# Patient Record
Sex: Female | Born: 1955 | Race: White | Hispanic: No | Marital: Married | Smoking: Never smoker
Health system: Southern US, Community
[De-identification: ages and names within clinical notes are randomized; demographics above are authoritative.]

## PROBLEM LIST (undated history)

## (undated) DIAGNOSIS — E119 Type 2 diabetes mellitus without complications: Secondary | ICD-10-CM

## (undated) HISTORY — PX: ABDOMINAL SURGERY: SHX537

---

## 2021-01-09 ENCOUNTER — Emergency Department: Payer: Medicare Other

## 2021-01-09 ENCOUNTER — Other Ambulatory Visit: Payer: Self-pay

## 2021-01-09 ENCOUNTER — Emergency Department
Admission: EM | Admit: 2021-01-09 | Discharge: 2021-01-09 | Disposition: A | Discharge status: Home / Self Care | Payer: Medicare Other | Attending: Emergency Medicine | Admitting: Emergency Medicine

## 2021-01-09 DIAGNOSIS — R519 Headache, unspecified: Secondary | ICD-10-CM | POA: Diagnosis not present

## 2021-01-09 DIAGNOSIS — M25522 Pain in left elbow: Secondary | ICD-10-CM | POA: Insufficient documentation

## 2021-01-09 DIAGNOSIS — S62102A Fracture of unspecified carpal bone, left wrist, initial encounter for closed fracture: Secondary | ICD-10-CM | POA: Insufficient documentation

## 2021-01-09 DIAGNOSIS — Z23 Encounter for immunization: Secondary | ICD-10-CM | POA: Diagnosis not present

## 2021-01-09 DIAGNOSIS — S50312A Abrasion of left elbow, initial encounter: Secondary | ICD-10-CM | POA: Insufficient documentation

## 2021-01-09 DIAGNOSIS — E119 Type 2 diabetes mellitus without complications: Secondary | ICD-10-CM | POA: Diagnosis not present

## 2021-01-09 DIAGNOSIS — W19XXXA Unspecified fall, initial encounter: Secondary | ICD-10-CM

## 2021-01-09 DIAGNOSIS — Y9289 Other specified places as the place of occurrence of the external cause: Secondary | ICD-10-CM | POA: Insufficient documentation

## 2021-01-09 DIAGNOSIS — W01198A Fall on same level from slipping, tripping and stumbling with subsequent striking against other object, initial encounter: Secondary | ICD-10-CM | POA: Insufficient documentation

## 2021-01-09 DIAGNOSIS — Y9389 Activity, other specified: Secondary | ICD-10-CM | POA: Insufficient documentation

## 2021-01-09 DIAGNOSIS — I10 Essential (primary) hypertension: Secondary | ICD-10-CM | POA: Insufficient documentation

## 2021-01-09 DIAGNOSIS — T148XXA Other injury of unspecified body region, initial encounter: Secondary | ICD-10-CM

## 2021-01-09 DIAGNOSIS — S6992XA Unspecified injury of left wrist, hand and finger(s), initial encounter: Secondary | ICD-10-CM | POA: Diagnosis present

## 2021-01-09 HISTORY — DX: Type 2 diabetes mellitus without complications: E11.9

## 2021-01-09 LAB — COMPREHENSIVE METABOLIC PANEL
ALT: 16 U/L (ref 0–44)
AST: 29 U/L (ref 15–41)
Albumin: 3.5 g/dL (ref 3.5–5.0)
Alkaline Phosphatase: 70 U/L (ref 38–126)
Anion gap: 6 (ref 5–15)
BUN: 20 mg/dL (ref 8–23)
CO2: 23 mmol/L (ref 22–32)
Calcium: 8.3 mg/dL — ABNORMAL LOW (ref 8.9–10.3)
Chloride: 108 mmol/L (ref 98–111)
Creatinine, Ser: 0.97 mg/dL (ref 0.44–1.00)
GFR, Estimated: >60 mL/min (ref >60)
Glucose, Bld: 153 mg/dL — ABNORMAL HIGH (ref 70–99)
Potassium: 4.4 mmol/L (ref 3.5–5.1)
Sodium: 137 mmol/L (ref 135–145)
Total Bilirubin: 0.5 mg/dL (ref 0.3–1.2)
Total Protein: 6.3 g/dL — ABNORMAL LOW (ref 6.5–8.1)

## 2021-01-09 LAB — CBC WITH DIFFERENTIAL/PLATELET
Abs Immature Granulocytes: 0.02 10*3/uL (ref 0.00–0.07)
Basophils Absolute: 0 10*3/uL (ref 0.0–0.1)
Basophils Relative: 0 %
Eosinophils Absolute: 0.3 10*3/uL (ref 0.0–0.5)
Eosinophils Relative: 6 %
HCT: 32.6 % — ABNORMAL LOW (ref 36.0–46.0)
Hemoglobin: 10.5 g/dL — ABNORMAL LOW (ref 12.0–15.0)
Immature Granulocytes: 0 %
Lymphocytes Relative: 23 %
Lymphs Abs: 1.3 10*3/uL (ref 0.7–4.0)
MCH: 28.7 pg (ref 26.0–34.0)
MCHC: 32.2 g/dL (ref 30.0–36.0)
MCV: 89.1 fL (ref 80.0–100.0)
Monocytes Absolute: 0.6 10*3/uL (ref 0.1–1.0)
Monocytes Relative: 10 %
Neutro Abs: 3.5 10*3/uL (ref 1.7–7.7)
Neutrophils Relative %: 61 %
Platelets: 227 10*3/uL (ref 150–400)
RBC: 3.66 MIL/uL — ABNORMAL LOW (ref 3.87–5.11)
RDW: 13.7 % (ref 11.5–15.5)
WBC: 5.8 10*3/uL (ref 4.0–10.5)
nRBC: 0 % (ref 0.0–0.2)

## 2021-01-09 LAB — TROPONIN I (HIGH SENSITIVITY): Troponin I (High Sensitivity): 17 ng/L (ref <18)

## 2021-01-09 MED ORDER — TETANUS-DIPHTH-ACELL PERTUSSIS 5-2.5-18.5 LF-MCG/0.5 IM SUSY
0.5000 mL | PREFILLED_SYRINGE | Freq: Once | INTRAMUSCULAR | Status: AC
  Administered 2021-01-09: 0.5 mL via INTRAMUSCULAR
  Filled 2021-01-09: qty 0.5

## 2021-01-09 MED ORDER — MORPHINE SULFATE (PF) 4 MG/ML IV SOLN
INTRAVENOUS | Status: AC
  Administered 2021-01-09: 4 mg via INTRAVENOUS
  Filled 2021-01-09: qty 1

## 2021-01-09 MED ORDER — ONDANSETRON HCL 4 MG/2ML IJ SOLN: 4.0000 mg | Freq: Once | INTRAMUSCULAR | Status: AC

## 2021-01-09 MED ORDER — ONDANSETRON HCL 4 MG/2ML IJ SOLN
INTRAMUSCULAR | Status: AC
  Administered 2021-01-09: 4 mg via INTRAVENOUS
  Filled 2021-01-09: qty 2

## 2021-01-09 MED ORDER — MORPHINE SULFATE (PF) 4 MG/ML IV SOLN: 4.0000 mg | Freq: Once | INTRAVENOUS | Status: AC

## 2021-01-09 MED ORDER — DIPHENHYDRAMINE HCL 50 MG/ML IJ SOLN
INTRAMUSCULAR | Status: AC
  Administered 2021-01-09: 25 mg via INTRAVENOUS
  Filled 2021-01-09: qty 1

## 2021-01-09 MED ORDER — DIPHENHYDRAMINE HCL 50 MG/ML IJ SOLN: 25.0000 mg | Freq: Once | INTRAMUSCULAR | Status: AC

## 2021-01-09 NOTE — ED Notes (Signed)
Pt with itchy red rash above IV site after morphine/zofran administration- EPD notified.

## 2021-01-09 NOTE — ED Provider Notes (Signed)
[facility] Emergency Department Provider Note  ____________________________________________   Event Date/Time   First MD Initiated Contact with Patient 01/09/21 1529     (approximate)  I have reviewed the triage vital signs and the nursing notes.   HISTORY  Chief Complaint Fall   HPI Teresa Hood is a 65 y.o. female who is coming out of bathroom and thinks she tripped.  She fell forward and hit her nose and complains of left wrist pain and deformity.  She also has elbow pain she has not skin tear over the left lateral elbow.  She thinks she hit her nose and had nosebleeding earlier but it has stopped now.  EMS reported the patient was weak and pale after standing up again.  Wrist and elbow pain is worse with movement.        Past Medical History:  Diagnosis Date   Diabetes mellitus without complication (HCC)   Also history of A. fib and GI surgery.  There are no problems to display for this patient.   Past Surgical History:  Procedure Laterality Date   ABDOMINAL SURGERY      Prior to Admission medications   Not on File    Allergies Keflex [cephalexin] and Iron dextran  History reviewed. No pertinent family history.  Social History Social History   Tobacco Use   Smoking status: Never   Smokeless tobacco: Never    Review of Systems  Constitutional: No fever/chills Eyes: No visual changes. ENT: No sore throat. Cardiovascular: Denies chest pain. Respiratory: Denies shortness of breath. Gastrointestinal: No abdominal pain.  No nausea, no vomiting.  No diarrhea.  No constipation. Genitourinary: Negative for dysuria. Musculoskeletal: Negative for back pain. Skin: Negative for rash. Neurological: Negative for headaches, focal weakness   ____________________________________________   PHYSICAL EXAM:  VITAL SIGNS: ED Triage Vitals  Enc Vitals Group     BP 01/09/21 1528 (!) 97/58     Pulse Rate 01/09/21 1528 68     Resp  01/09/21 1528 18     Temp 01/09/21 1536 97.8 F (36.6 C)     Temp Source 01/09/21 1536 Oral     SpO2 01/09/21 1528 91 %     Weight 01/09/21 1529 96 lb (43.5 kg)     Height 01/09/21 1529 5' (1.524 m)     Head Circumference --      Peak Flow --      Pain Score 01/09/21 1529 8     Pain Loc --      Pain Edu? --      Excl. in GC? --     Constitutional: Alert and oriented. Well appearing and in no acute distress. Eyes: Conjunctivae are normal. PERRL. EOMI. Head: Atraumatic. Nose: No congestion/rhinnorhea.  There is blown up inside the nose but no active bleeding no swelling and no nasal tenderness Mouth/Throat: Mucous membranes are moist.  Oropharynx non-erythematous. Neck: No stridor.  No cervical spine tenderness to palpation. Cardiovascular: Normal rate, regular rhythm. Grossly normal heart sounds.  Good peripheral circulation. Respiratory: Normal respiratory effort.  No retractions. Lungs CTAB. Gastrointestinal: Soft and nontender. No distention. No abdominal bruits. No CVA tenderness. Musculoskeletal: No lower extremity tenderness nor edema.  See HPI for upper extremities Neurologic:  Normal speech and language. No gross focal neurologic deficits are appreciated.  Skin:  Skin is warm, dry and intact.  Except for left elbow no rash noted.   ____________________________________________   LABS (all labs ordered are listed, but only abnormal results are  displayed)  Labs Reviewed  COMPREHENSIVE METABOLIC PANEL - Abnormal; Notable for the following components:      Result Value   Glucose, Bld 153 (*)    Calcium 8.3 (*)    Total Protein 6.3 (*)    All other components within normal limits  CBC WITH DIFFERENTIAL/PLATELET - Abnormal; Notable for the following components:   RBC 3.66 (*)    Hemoglobin 10.5 (*)    HCT 32.6 (*)    All other components within normal limits  TROPONIN I (HIGH SENSITIVITY)   ____________________________________________  EKG  EKG read interpreted by  me shows normal sinus rhythm rate of 67 normal axis no acute ST-T wave changes some low voltage in the precordial leads. ____________________________________________  RADIOLOGY Jill Poling, personally viewed and evaluated these images (plain radiographs) as part of my medical decision making, as well as reviewing the written report by the radiologist.  ED MD interpretation: X-ray of the wrist shows a almost nondisplaced ulnar fracture and slightly displaced radius fracture.  Radiology read the films and I reviewed them.  I do not see anything in the elbow needed this radiology.  There is a suggestion of possibly a anterior fat pad is a little bigger than I would expect in the elbow and patient does complain of some pain they are not sure if the pain is due to her skin tear or not she does not want a move it because it makes the wrist hurt.  I will give her a long-arm splint.  Just in case.  Chest x-ray read by radiology reviewed by me shows no acute problems and this is also true of the is CT of the head and C-spine both of which were reviewed by radiology and myself.  Official radiology report(s): DG Elbow Complete Left  Result Date: 01/09/2021 CLINICAL DATA:  Left elbow pain following a fall today. EXAM: LEFT ELBOW - COMPLETE 3+ VIEW COMPARISON:  None. FINDINGS: Moderate-sized olecranon enthesophyte. Mild anterior coronoid process spur formation. No fracture, dislocation or effusion. IMPRESSION: No fracture or dislocation. Electronically Signed   By: Beckie Salts M.D.   On: 01/09/2021 16:08   DG Wrist Complete Left  Result Date: 01/09/2021 CLINICAL DATA:  Left wrist pain following a fall today. EXAM: LEFT WRIST - COMPLETE 3+ VIEW COMPARISON:  None. FINDINGS: Comminuted fracture of the distal radial metaphysis and epiphysis with extension into the radiocarpal joint at the level of the radial styloid. Mild radial displacement of the distal fragments as well as ventral angulation of the distal  fragments. There is also a minimally displaced fracture of the distal ulnar metaphysis with minimal ventral displacement and minimal ventral angulation of the distal fragment. IMPRESSION: Distal radius and ulna fractures, as described above. Electronically Signed   By: Beckie Salts M.D.   On: 01/09/2021 16:10   CT HEAD WO CONTRAST ( )  Result Date: 01/09/2021 CLINICAL DATA:  Larey Seat and hit her nose. History of atrial fibrillation. EXAM: CT HEAD WITHOUT CONTRAST CT CERVICAL SPINE WITHOUT CONTRAST TECHNIQUE: Multidetector CT imaging of the head and cervical spine was performed following the standard protocol without intravenous contrast. Multiplanar CT image reconstructions of the cervical spine were also generated. COMPARISON:  None. FINDINGS: CT HEAD FINDINGS Brain: Normal appearing cerebral hemispheres and posterior fossa structures. Normal size and position of the ventricles. No intracranial hemorrhage, mass lesion or CT evidence of acute infarction. Vascular: No hyperdense vessel or unexpected calcification. Skull: Normal. Negative for fracture or focal lesion. Sinuses/Orbits: Status  post bilateral cataract extraction. Unremarkable bones and included paranasal sinuses. Other: None. CT CERVICAL SPINE FINDINGS Alignment: Normal. Skull base and vertebrae: No acute fracture. No primary bone lesion or focal pathologic process. Soft tissues and spinal canal: No prevertebral fluid or swelling. No visible canal hematoma. Disc levels: Mild facet degenerative changes at multiple levels. Otherwise, unremarkable. Upper chest: Clear lung apices. Other: Bilateral carotid artery calcifications. IMPRESSION: 1. Normal head CT. 2. No cervical spine fracture or subluxation. 3. Bilateral carotid artery atheromatous calcifications. Electronically Signed   By: Beckie Salts M.D.   On: 01/09/2021 16:04   CT Cervical Spine Wo Contrast  Result Date: 01/09/2021 CLINICAL DATA:  Larey Seat and hit her nose. History of atrial fibrillation.  EXAM: CT HEAD WITHOUT CONTRAST CT CERVICAL SPINE WITHOUT CONTRAST TECHNIQUE: Multidetector CT imaging of the head and cervical spine was performed following the standard protocol without intravenous contrast. Multiplanar CT image reconstructions of the cervical spine were also generated. COMPARISON:  None. FINDINGS: CT HEAD FINDINGS Brain: Normal appearing cerebral hemispheres and posterior fossa structures. Normal size and position of the ventricles. No intracranial hemorrhage, mass lesion or CT evidence of acute infarction. Vascular: No hyperdense vessel or unexpected calcification. Skull: Normal. Negative for fracture or focal lesion. Sinuses/Orbits: Status post bilateral cataract extraction. Unremarkable bones and included paranasal sinuses. Other: None. CT CERVICAL SPINE FINDINGS Alignment: Normal. Skull base and vertebrae: No acute fracture. No primary bone lesion or focal pathologic process. Soft tissues and spinal canal: No prevertebral fluid or swelling. No visible canal hematoma. Disc levels: Mild facet degenerative changes at multiple levels. Otherwise, unremarkable. Upper chest: Clear lung apices. Other: Bilateral carotid artery calcifications. IMPRESSION: 1. Normal head CT. 2. No cervical spine fracture or subluxation. 3. Bilateral carotid artery atheromatous calcifications. Electronically Signed   By: Beckie Salts M.D.   On: 01/09/2021 16:04   DG Chest Portable 1 View  Result Date: 01/09/2021 CLINICAL DATA:  Weakness. EXAM: PORTABLE CHEST 1 VIEW COMPARISON:  None. FINDINGS: Enlarged cardiac silhouette. Calcific atherosclerotic disease of the aorta. There is no evidence of focal airspace consolidation, pleural effusion or pneumothorax. Prior multilevel lower thoracic spine kyphoplasty. Soft tissues are grossly normal. IMPRESSION: 1. Enlarged cardiac silhouette. 2. Calcific atherosclerotic disease of the aorta. Electronically Signed   By: Ted Mcalpine M.D.   On: 01/09/2021 16:08     ____________________________________________   PROCEDURES  Procedure(s) performed (including Critical Care):  Procedures   ____________________________________________   INITIAL IMPRESSION / ASSESSMENT AND PLAN / ED COURSE  Patient with a wrist fracture after tripping.  She feels fine now she has had no further nosebleeding I do not see a nasal septal hematoma.  Her husband will keep a close eye on her tonight.  They will follow up with a repeat with a orthopedic surgeon that they like in [facility].  I have given him the name of Dr. Rosita Kea here just in case.  I discussed in great detail the need to return if she has any problems with weakness or dizziness or increasing pain or numbness or blueness or whiteness of the fingers.  I do not see any obvious etiology for her fall other than tripping.  She does report that she had 1 episode of hypotension and was treated in the hospital with fluids and resolved after they put a central line in her.              ____________________________________________   FINAL CLINICAL IMPRESSION(S) / ED DIAGNOSES  Final diagnoses:  Fall, initial encounter  Abrasion  Wrist fracture, closed, left, initial encounter     ED Discharge Orders     None        Note:  This document was prepared using Dragon voice recognition software and may include unintentional dictation errors.    Arnaldo NatalMalinda, Melat Wrisley F, MD 01/09/21 2337

## 2021-01-09 NOTE — ED Notes (Signed)
Redness above IV is not present upon reassessment at this time

## 2021-01-09 NOTE — ED Triage Notes (Signed)
Pt was at restaurant coming out of bathroom when she fell for reason unknown by her. Pt c/o left wrist pain/deformity and states she hit her nose. Unsure if LOC. EMS report pt became weak and pale after standing again. Pt has history of A-fibb, GI surgery, DM. Pt reports eating well today, BGL 142 per EMS. Pt is AOX4, NAD noted.

## 2021-01-09 NOTE — Discharge Instructions (Addendum)
Keep your wrist elevated is much as possible use the sling.  You can put ice wrapped up in a towel on it for 20 minutes every hour while you are awake.  Do not use ice or heat on this while you are sleeping because you can get burns or frostbite.  Use your pain medication and nausea medication as needed for the pain.  Follow-up with your orthopedic surgeon or Dr. Rosita Kea who is our orthopedic surgeon on-call here.  Call them first thing in the morning on Monday and let them know you have a wrist fracture, they should see you sometime in the next few days.  Please return for increasing pain numbness or if your fingers get blue or white or cold.  Sometimes there can be enough swelling to put pressure on the splint and cause the blood flow to your hand to be cut off.  This is a serious problem and has to be addressed rapidly so you do not lose the function in your hand permanently. Please return or see your primary care doctor if you get weak or dizzy or have any other trouble with falling.

## 2022-09-04 IMAGING — CT CT HEAD W/O CM
3 series · 15 of 47 positions shown, 18 images · non-contrast
Comparison: None.

CLINICAL DATA: Fell and hit her nose. History of atrial
fibrillation.

EXAM:
CT HEAD WITHOUT CONTRAST
CT CERVICAL SPINE WITHOUT CONTRAST
TECHNIQUE: Multidetector CT imaging of the head and cervical spine was
performed following the standard protocol without intravenous
contrast. Multiplanar CT image reconstructions of the cervical spine
were also generated.

[Series 2: head wo · axial · 0.45mm/px · z∈[-189,-49]mm · 9 of 34 slices shown, 12 images]
[im 3/34  brain]
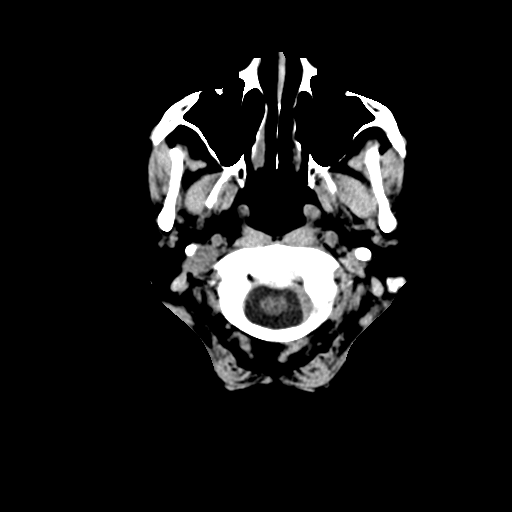
[im 3/34  bone]
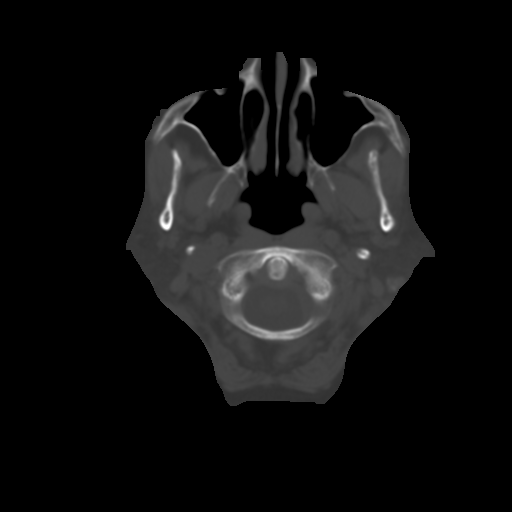
[im 6/34  brain]
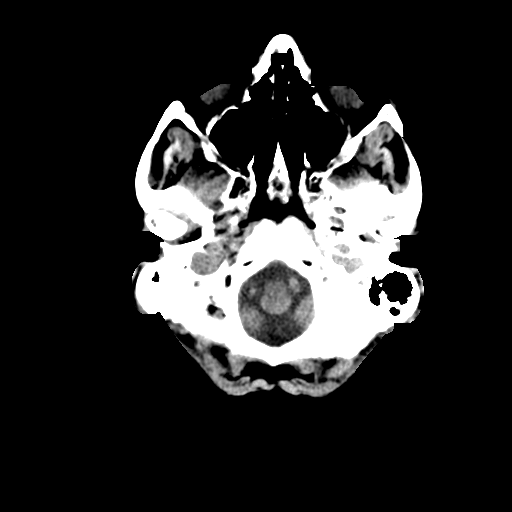
[im 10/34  brain]
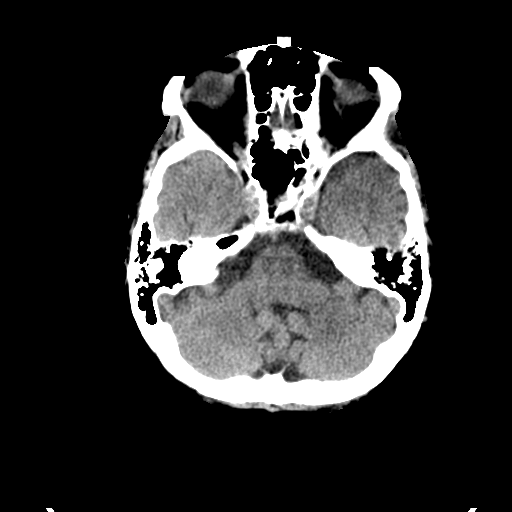
[im 13/34  brain]
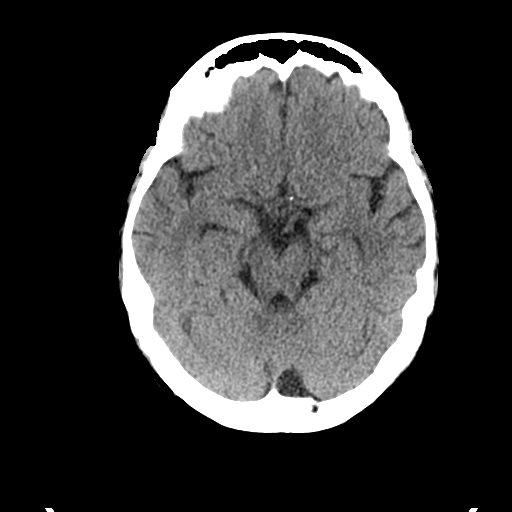
[im 18/34  brain]
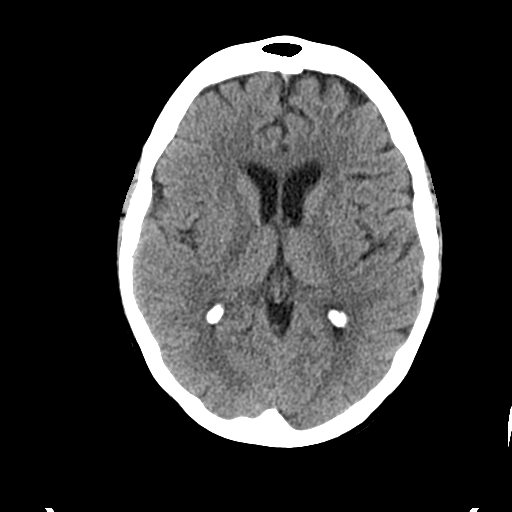
[im 18/34  bone]
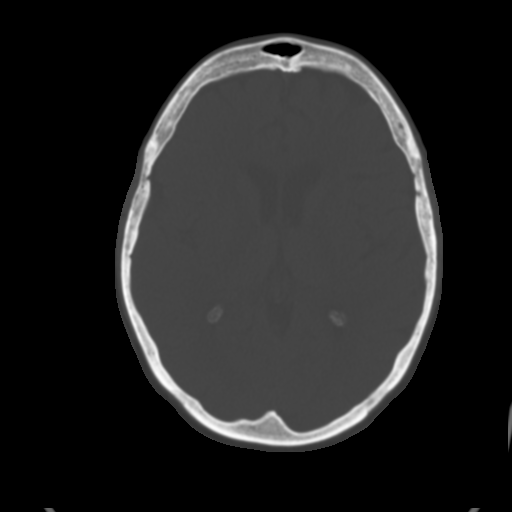
[im 21/34  brain]
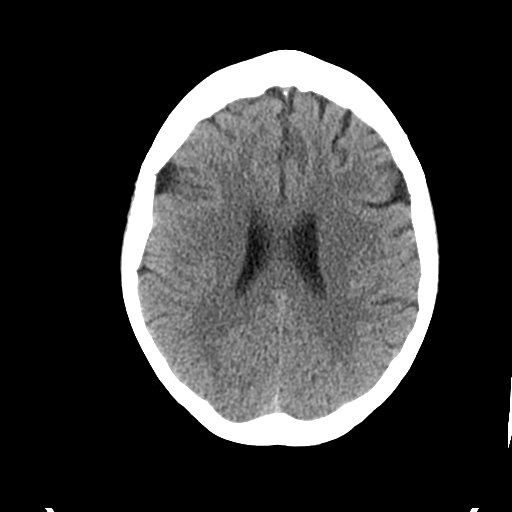
[im 24/34  brain]
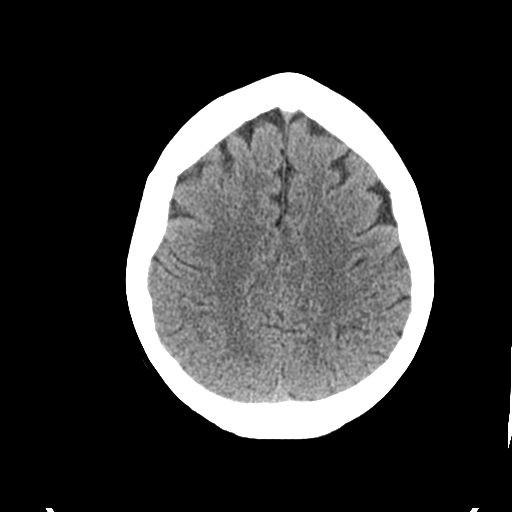
[im 28/34  brain]
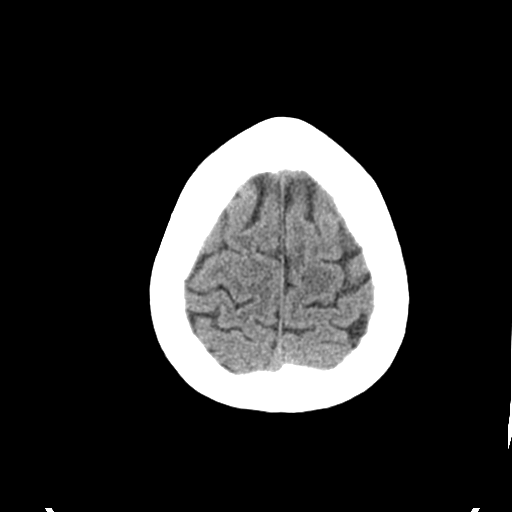
[im 31/34  brain]
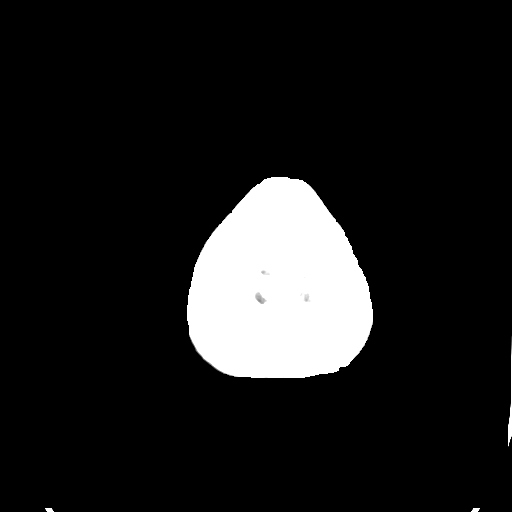
[im 31/34  bone]
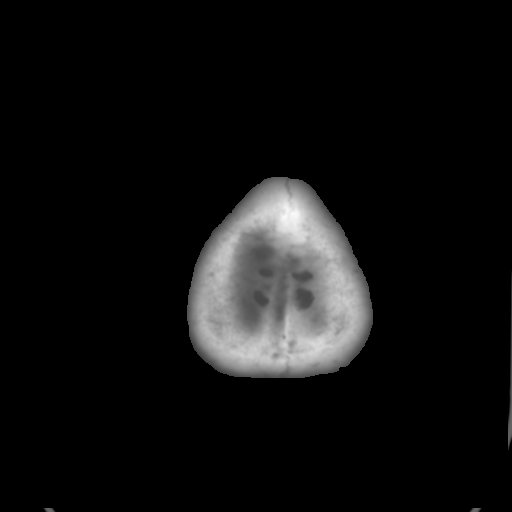

[Series 4: coronal soft tissue · coronal · 0.31mm/px · 3 of 66 slices shown]
[im 22/66  brain]
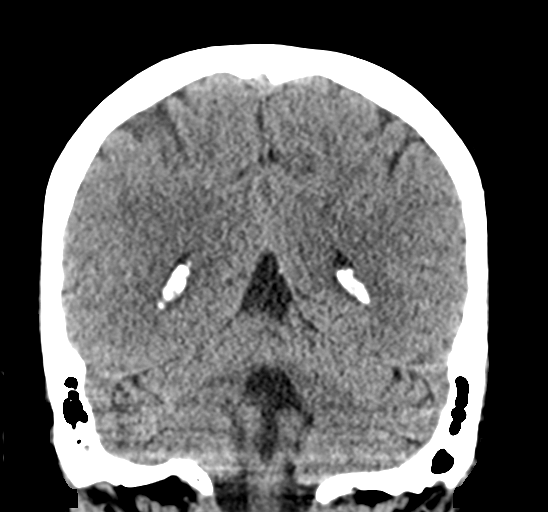
[im 29/66  brain]
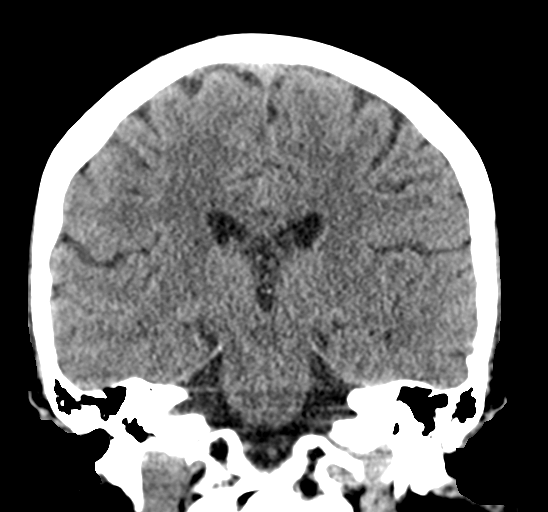
[im 37/66  brain]
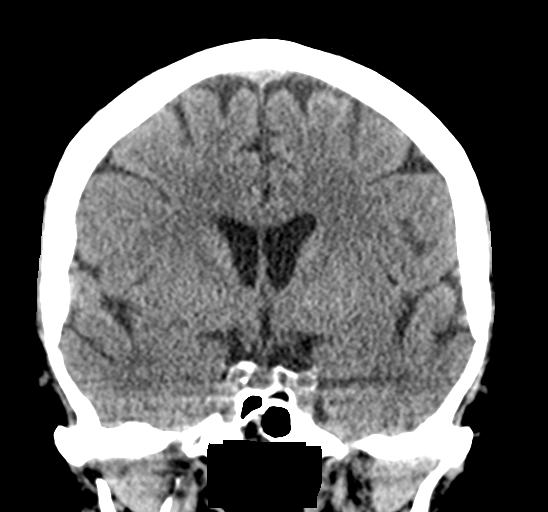

[Series 5: sagittal soft tissue · sagittal · 0.31mm/px · 3 of 55 slices shown]
[im 19/55  brain]
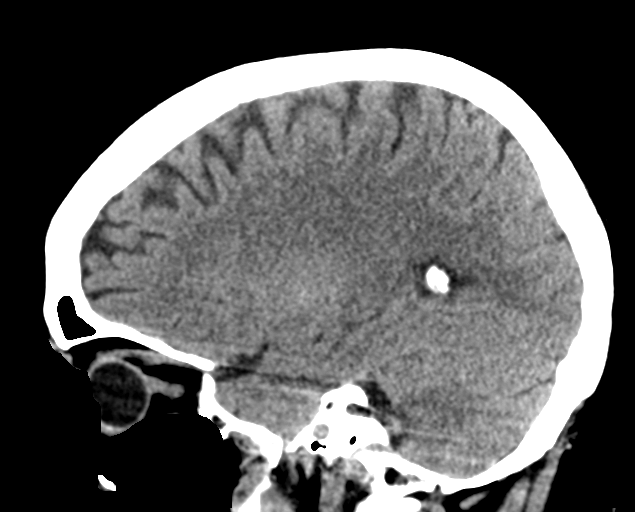
[im 28/55  brain]
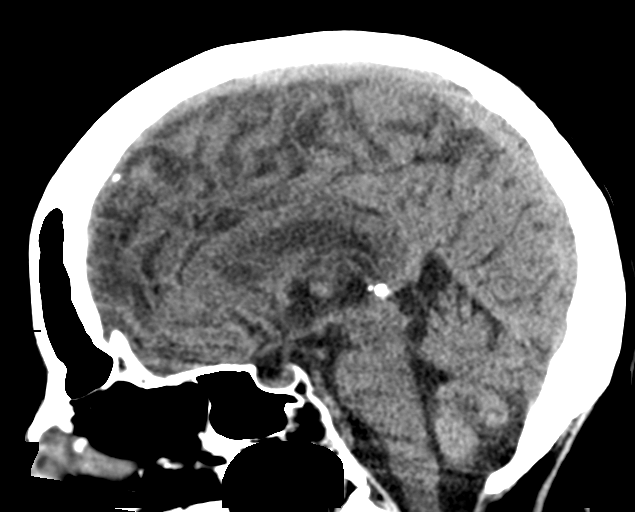
[im 37/55  brain]
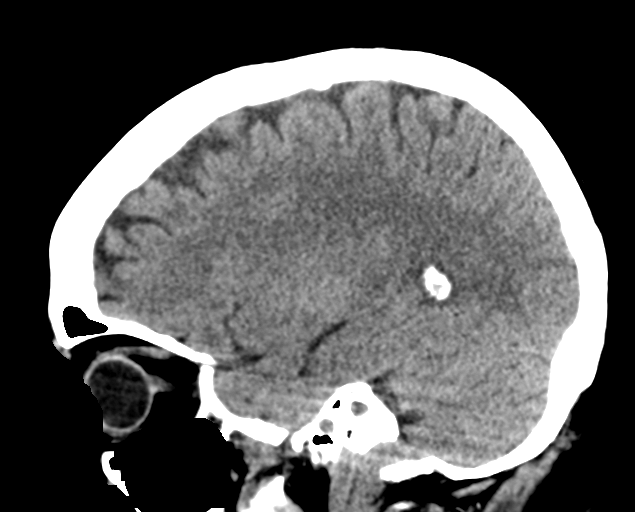

[15 of 47 positions shown; findings below may reference images not displayed]

FINDINGS: CT HEAD FINDINGS

Brain: Normal appearing cerebral hemispheres and posterior fossa
structures. Normal size and position of the ventricles. No
intracranial hemorrhage, mass lesion or CT evidence of acute
infarction.

Vascular: No hyperdense vessel or unexpected calcification.

Skull: Normal. Negative for fracture or focal lesion.

Sinuses/Orbits: Status post bilateral cataract extraction.
Unremarkable bones and included paranasal sinuses.

Other: None.

CT CERVICAL SPINE FINDINGS

Alignment: Normal.

Skull base and vertebrae: No acute fracture. No primary bone lesion
or focal pathologic process.

Soft tissues and spinal canal: No prevertebral fluid or swelling. No
visible canal hematoma.

Disc levels: Mild facet degenerative changes at multiple levels.
Otherwise, unremarkable.

Upper chest: Clear lung apices.

Other: Bilateral carotid artery calcifications.
IMPRESSION: 1. Normal head CT.
2. No cervical spine fracture or subluxation.
3. Bilateral carotid artery atheromatous calcifications.
